# Patient Record
Sex: Female | Born: 1945 | Race: White | Hispanic: No | Marital: Married | Smoking: Never smoker
Health system: Southern US, Community
[De-identification: ages and names within clinical notes are randomized; demographics above are authoritative.]

---

## 2012-09-24 ENCOUNTER — Ambulatory Visit: Payer: Self-pay | Admitting: Sports Medicine

## 2012-10-29 ENCOUNTER — Ambulatory Visit (INDEPENDENT_AMBULATORY_CARE_PROVIDER_SITE_OTHER): Payer: Medicare Other | Admitting: Sports Medicine

## 2012-10-29 ENCOUNTER — Encounter: Payer: Self-pay | Admitting: Sports Medicine

## 2012-10-29 VITALS — BP 146/70 | Ht 66.0 in | Wt 137.0 lb

## 2012-10-29 DIAGNOSIS — M533 Sacrococcygeal disorders, not elsewhere classified: Secondary | ICD-10-CM

## 2012-10-29 NOTE — Progress Notes (Signed)
CC: Left SI joint pain HPI: Patient is a pleasant 67 year old female who presents as a new patient for left SI joint pain. She states that occasionally it is in both sides but is always worse than the left. She's is currently in physical therapy and has been working with a therapist for the last year. She states that the therapist "puts the SI joint back in place". She has been doing a lot of stretching and exercises. At times the SI joint feels like a lockup. This has been going on for about 4 years. She has a constant dull ache that is worse with certain movements and at times is so severe that she is immobilized. She's tried ice and heat but no NSAIDs due to her sensitive stomach. She is very bad asked reflux. She denies any radicular pain, paresthesias. Sometimes her pain improves when she flexes her hips up towards her body.  ROS: As above in the HPI. All other systems are stable or negative.  PMH: Acid reflux  Social: Patient is married. She does not smoke or drink alcohol. Family: Family history is negative for heart disease, high blood pressure, diabetes  Allergies: Compazine    OBJECTIVE: APPEARANCE:  Patient in no acute distress.The patient appeared well nourished and normally developed. HEENT: No scleral icterus. Conjunctiva non-injected Resp: Non labored Skin: No rash MSK:  Low back exam: - Full range of motion in flexion, extension, lateral bending, rotation. Patient has pain with extension.  - No tenderness to palpation over the spinous processes of the lumbar vertebra - Tenderness to palpation in the left SI joint. There is noted to be large fascial nodules at the SI joint on the left and right.  - Negative straight leg raise - Strength 5 out of 5 in the bilateral lower extremities - Reflexes 2+ bilaterally.  - No pain in hip rotation. Normal hip strength. - No leg length discrepancy - Excessive lumbar lordosis with anterior pelvic tilt  ASSESSMENT: SI joint dysfunction    PLAN: Recommended that patient continue to work with therapist if she feels that she benefits from this. She does have large SI joint nodules consistent with previous tear of lumbar fascia bilaterally. Think that she would benefit most from pelvic stabilization exercises to help offset her anterior pelvic tilt. She was given the following exercise recommendations. Followup in 2 months. Continue to do your cats and camels exercises Add these exercises: - Do pelvic tilt 5 breaths and 5 sets - Both knees to chest 5 breaths x 5 sets - Do pretzel stretch on side of bed 4 sets of 30 sec - Full sit up sets of 15. Work up to [employer]. - Knee to opposite shoulder sets of 15. Work up to [employer].

## 2012-10-29 NOTE — Patient Instructions (Addendum)
Thank you for coming in today You have SI Joint dysfunction. We need to retrain your pelvis to stay in position. Continue to do your cats and camels exercises Add these exercises: - Do pelvic tilt 5 breaths and 5 sets - Both knees to chest 5 breaths x 5 sets - Do pretzel stretch on side of bed 4 sets of 30 sec - Full sit up sets of 15. Work up to [employer]. - Knee to opposite shoulder sets of 15. Work up to [employer].

## 2012-12-30 ENCOUNTER — Ambulatory Visit: Payer: Medicare Other | Admitting: Sports Medicine

## 2013-07-28 ENCOUNTER — Encounter: Payer: Self-pay | Admitting: Sports Medicine

## 2013-07-28 ENCOUNTER — Ambulatory Visit (INDEPENDENT_AMBULATORY_CARE_PROVIDER_SITE_OTHER): Payer: Medicare Other | Admitting: Sports Medicine

## 2013-07-28 VITALS — BP 147/74 | Ht 66.0 in | Wt 129.0 lb

## 2013-07-28 DIAGNOSIS — M21611 Bunion of right foot: Secondary | ICD-10-CM | POA: Insufficient documentation

## 2013-07-28 DIAGNOSIS — M533 Sacrococcygeal disorders, not elsewhere classified: Secondary | ICD-10-CM

## 2013-07-28 DIAGNOSIS — M21619 Bunion of unspecified foot: Secondary | ICD-10-CM

## 2013-07-28 DIAGNOSIS — M21612 Bunion of left foot: Secondary | ICD-10-CM

## 2013-07-28 DIAGNOSIS — M23305 Other meniscus derangements, unspecified medial meniscus, unspecified knee: Secondary | ICD-10-CM

## 2013-07-28 NOTE — Assessment & Plan Note (Signed)
She has seen significant improvement by doing the SI joint exercises  I advised her to continue these at least 3 times a week

## 2013-07-28 NOTE — Patient Instructions (Addendum)
Your knees look good Some cartilage splits which are common  Use stationary bike starting at 15 mins 3 x week Build 5 mins per week Don't increase tension until you get to 45 mins  Straight leg raise with foot turned outward Build up to 15 slowly  With equipment don't do too much knee bend  Walking 3 x week  Start at 20 to 30 mins  See me as needed

## 2013-07-28 NOTE — Assessment & Plan Note (Signed)
sports insoles to provide some arch support  This felt very comfortable and also gave her less knee pain

## 2013-07-28 NOTE — Assessment & Plan Note (Addendum)
Musculoskeletal ultrasound Evaluation of the left knee reveals 1 area of medial meniscal irregularity and loss of thickness/ lateral meniscus normal No effusion is noted and normal quadriceps and patellar tendons  Right knee by comparison show normal medial meniscus and normal other structures    Likely Left medical meniscus tear that has healed - Discussed wrapping knee and specific exercises as well as low impact exercises such as biking - Start walking 3 x week for 20 to 30 mins - follow up PRN

## 2013-07-28 NOTE — Progress Notes (Signed)
Patient ID: Madison Anderson Kathol, female   DOB: [birth year], 68 y.o.   MRN: [id number]    [facility] [address] [place], [place] [address] Phone: [phone] Fax: [phone]   Patient Name: Madison Anderson Mearns Date of Birth: [birth year] Medical Record Number: [id number] Gender: female Date of Encounter: 07/28/2013  History of Present Illness:  Madison Anderson Lacewell is a 68 y.o. very pleasant female patient who presents with the following:  L knee pain  She states that about 6-7 weeks ago she was putting sheets on her bed when she twisted her knee wrong causinga  Popping sound and immediate medial knee pain with some swelling. She states that there was some popping intermittently in that knee but denies locking. She does have some muscular weakness and point tenderness in her L lat leg below the knee, in the popliteal fossa, and on L lat thigh she feels is associated. She states that sometimes it hurts to push on it and sometimes the pain radiates down the medial part of her leg.   Currently she has no mechanical symptoms emanate has not continued to swell Her pain level is down to a mild level and sometimes not bothersome at all  She notes some bilateral foot pain at times  Sacroiliac joint has improved substantially since last visit. However she has not been doing the exercises until we evaluated her knee  She would like to return to the gym if we think she can do so without risk  History  Substance Use Topics  . Smoking status: Never Smoker   . Smokeless tobacco: Never Used  . Alcohol Use: Not on file   No family history on file. Allergies  Allergen Reactions  . Compazine [Prochlorperazine]   . Flexeril [Cyclobenzaprine]     Medication list has been reviewed and updated.  Prior to Admission medications   Medication Sig Start Date End Date Taking? Authorizing Provider  Estrogens, Conjugated (PREMARIN VA) Place vaginally.    Historical Provider, MD  metoprolol  succinate (TOPROL-XL) 25 MG 24 hr tablet  10/20/12   Historical Provider, MD  ranitidine (ZANTAC) 300 MG tablet  10/22/12   Historical Provider, MD  sucralfate (CARAFATE) 1 G tablet  10/06/12   Historical Provider, MD  sucralfate (CARAFATE) 1 G tablet Take 1 g by mouth 4 (four) times daily.    Historical Provider, MD    Review of Systems:  No fevers, chills Otherwise Per HPI  Physical Examination: Filed Vitals:   07/28/13 1101  BP: 147/74   Filed Vitals:   07/28/13 1101  Height: 5\' 6"  (1.676 m)  Weight: 129 lb (58.514 kg)   Body mass index is 20.83 kg/(m^2).  Gen: NAD, alert, cooperative with exam Neuro: Alert and oriented, No gross deficits MSK:  Knee:LT Normal to inspection with no erythema or effusion or obvious bony abnormalities. Palpation normal with no warmth or joint line tenderness or patellar tenderness or condyle tenderness. ROM normal in flexion and extension and lower leg rotation. Ligaments with solid consistent endpoints including ACL, PCL, LCL, MCL. Negative Mcmurray's and provocative meniscal tests. Non painful patellar compression. Patellar and quadriceps tendons unremarkable. Hamstring and quadriceps strength is normal.  Patient shows early bunion formation bilaterally and some loss of the longitudinal arch  SI joint shows improvement movement and she does not get pain on knee to chest, or on knee to opposite shoulder or other stretches  Assessment and Plan: 68 y/o female with L knee pain, see problem specific a/p  Elenora GammaSamuel L Coreena Rubalcava, MD  Edited KBF

## 2015-02-01 ENCOUNTER — Ambulatory Visit: Payer: Medicare Other | Admitting: Sports Medicine

## 2015-02-15 ENCOUNTER — Ambulatory Visit: Payer: Medicare Other | Admitting: Sports Medicine

## 2016-07-03 ENCOUNTER — Ambulatory Visit: Payer: Medicare Other | Admitting: Sports Medicine

## 2016-08-07 ENCOUNTER — Encounter: Payer: Self-pay | Admitting: Sports Medicine

## 2016-08-07 ENCOUNTER — Ambulatory Visit (INDEPENDENT_AMBULATORY_CARE_PROVIDER_SITE_OTHER): Payer: Medicare Other | Admitting: Sports Medicine

## 2016-08-07 ENCOUNTER — Ambulatory Visit: Payer: Self-pay

## 2016-08-07 VITALS — BP 150/73 | Ht 66.0 in | Wt 135.0 lb

## 2016-08-07 DIAGNOSIS — G8929 Other chronic pain: Secondary | ICD-10-CM

## 2016-08-07 DIAGNOSIS — M25562 Pain in left knee: Secondary | ICD-10-CM | POA: Diagnosis present

## 2016-08-07 MED ORDER — DICLOFENAC SODIUM 1 % TD GEL: TRANSDERMAL | 0 refills | Status: AC

## 2016-08-07 NOTE — Patient Instructions (Addendum)
Do 3 sets of 10 of the H stretch, T stretch, and rowing stretch.  Start eating anti-inflammatory foods daily such as collards, beets, and green leafy vegetables.  Apply ice to the knee two to three times a day (10-15 minutes each time).  Apply voltaren gel (anti-inflammatory gel) to the knee up to three times per day for pain and inflammation

## 2016-08-07 NOTE — Progress Notes (Signed)
Subjective: CC: knee pain and migrating arthralgias HPI: Patient is a 71 y.o. female presenting to clinic today for L knee pain and migrating arthralgias.   L knee pain: this is the main reason for her visit.  Patient notes a nodule and tenderness to palpation inferior to the patella x 1 year that has progressively gotten worse.  Pain is only with palpation and accidentally hitting the ant. knee. The pain when she hits this area is sharp and severe.  No pain with ambulation at this spot.  Also, she notes pain in the anterior knee with ascending/descending stairs. Pain is 3/10, dull ache. No swelling of knee.  Nothing makes it better or worse.   She was previously very athletic, walking on treadmill regularly but stop 2 months ago due to social issues.  Arthralgias:  Patient had flu vaccine in Jan (unsure if this is the cause) Had pain in R shoulder in March, it then went to the L shoulder.  Then went to wrists, L>R.  Also notes pain with turning wrists L more severe than R. This pain comes and goes. Typically resolves within 3 days spontaneously.  No inciting factors.  No swelling, erythema, warmth noted.  Doesn't take medicine due to a sensitive stomach.  No fevers, chills or rashes.  Has not seen anyone about this.  No family h/o arthritis.   Social History: never smoker   ROS: All other systems reviewed and are negative.  Past Medical History Patient Active Problem List   Diagnosis Date Noted  . 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 Bilateral bunions 07/28/2013  . 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 metoprolol succinate (TOPROL-XL) 25 MG 24 hr tablet Take by mouth.    . sucralfate (CARAFATE) 1 g tablet Take by mouth.    . Estrogens, Conjugated (PREMARIN VA) Place vaginally.    Marland Kitchen. ketoconazole (NIZORAL) 2 % cream apply to affected area at bedtime  TO EARS.  0  . metoprolol succinate (TOPROL-XL) 25 MG 24 hr tablet     . omeprazole (PRILOSEC) 40 MG capsule Take 40 mg by mouth.    . ranitidine (ZANTAC) 300 MG tablet     . sucralfate (CARAFATE) 1 G tablet     . sucralfate (CARAFATE) 1 G tablet Take 1 g by mouth 4 (four) times daily.    . Vitamin D, Ergocalciferol, (DRISDOL) 50000 units CAPS capsule   0   No current facility-administered medications for this visit.     Objective: Office vital signs reviewed. BP (!) 150/73   Ht 5\' 6"  (1.676 m)   Wt 135 lb (61.2 kg)   BMI 21.79 kg/m    Physical Examination:  General: Awake, alert, well nourished, NAD  Wrists: normal to inspection. No swelling of hands or wrists noted. No bony abnormalities. No tenderness to palpation. Full ROM. 5/5 strength.   Shoulders:  Normal to inspection. Tenderness on top of rotator cuff insertion site on the L. No tenderness on the R. Full ROM without pain. Negative empty can test, Hawkins, Speeds, and Yergason. 5/5 strength in shoulders bilaterally.   Left knee: swelling inferior to the patella, around the tibial tuberosity. No erythema or ecchymoses.   No obvious Baker's cysts. Swelling is exquisitely tender to palpation (possibly around pes anserine bursa).  No warmth or joint line tenderness or patellar tenderness or condyle tenderness.  ROM normal in flexion (135 degrees) and  extension (0 degrees) and lower leg rotation. Ligaments with solid consistent endpoints including ACL, PCL, LCL, MCL.  Negative Anterior Drawer.  Negative Mcmurray's and Thessaly.  Non painful patellar compression.  Normal Patellar glide.  No apprehension  Patellar and quadriceps tendons unremarkable. Hamstring and quadriceps strength is normal.  Ultrasound Left knee:  significant swelling and inflammation noted at the pes anserine bursa. Mild fluid around sartorius and hamstring tendons, moderate fluid noted at gracilis tendon. Patellar tendon normal Tibial tubercle normal No  effusion noted at Suprapatellar pouch  Impression - pes anserine bursitis  Ultrasound and interpretation by Sibyl Parr. Fields, MD    Assessment/Plan: 71 y/o F with a PMHx of IBS and per her report numerous "inflammatory reactions" coming in with the pain complaint of L knee pain.  Pes anserine bursitis: history, exam, and [place] consistent with bursitis. Unknown trauma however noted to have significant edema around the gracilis tendon. Discussed management options. Patient opts for icing BID, voltaren gel TID PRN, and rest. Patient to f/u in 6 weeks.   Shoulder pain: patient reports joint pain diffusely intermittently. Suspect some of this could be secondary to posture (patient has shoulders rotated in). No exam findings consistent with RA or other rheumatologic diseases.   Discussed home exercises such as H and T stretches.   Joanna Puff PGY-3, [facility]  I observed and examined the patient with the resident and agree with assessment and plan.  Note reviewed and modified by me. Enid Baas, MD

## 2016-09-20 ENCOUNTER — Ambulatory Visit: Payer: Medicare Other | Admitting: Sports Medicine

## 2016-10-04 ENCOUNTER — Ambulatory Visit (INDEPENDENT_AMBULATORY_CARE_PROVIDER_SITE_OTHER): Payer: Medicare Other | Admitting: Sports Medicine

## 2016-10-04 ENCOUNTER — Encounter: Payer: Self-pay | Admitting: Sports Medicine

## 2016-10-04 DIAGNOSIS — M21612 Bunion of left foot: Secondary | ICD-10-CM

## 2016-10-04 DIAGNOSIS — M21611 Bunion of right foot: Secondary | ICD-10-CM | POA: Diagnosis not present

## 2016-10-04 DIAGNOSIS — G8929 Other chronic pain: Secondary | ICD-10-CM

## 2016-10-04 DIAGNOSIS — M25562 Pain in left knee: Secondary | ICD-10-CM | POA: Diagnosis not present

## 2016-10-04 MED ORDER — TRIAMCINOLONE ACETONIDE 10 MG/ML IJ SUSP
10.0000 mg | Freq: Once | INTRAMUSCULAR | Status: AC
  Administered 2016-10-04: 10 mg via INTRA_ARTICULAR

## 2016-10-04 NOTE — Assessment & Plan Note (Signed)
CSI today  Easy exercises with SLR  Heat and ICE prn

## 2016-10-04 NOTE — Progress Notes (Signed)
   Subjective:    Patient ID: Madison Anderson, female    DOB: [birth year], 71 y.o.   MRN: [id number]  HPI cc: left knee pain  Patient here for follow up of left knee pain after 6 weeks. Was instructed to do topical voltaren gel and ice. Ice made it worse. Did not use gel due to sensitivity to NSAIDs. Reports knee area will intermittently swell. Overall she feels the problem is slightly worse than when she came in 6 weeks ago. Denies new injury or trauma to the area.   Also endorses new left thumb pain that has been ongoing for 2 weeks. She did some heavy yardwork 2 weeks ago and it has been hurting ever since. The pain is deep in the base of the thumb and she feels her thumb catches at times  Review of Systems- no neck or lower back pain      Objective:   Physical Exam  Well nourished, well developed, in NAD BP (!) 142/76   Ht 5\' 6"  (1.676 m)   Wt 135 lb (61.2 kg)   BMI 21.79 kg/m   Left knee- no swelling or erythema, tender to palpation medial to tibial tuberosity. Knee joint is stable. Puffy over the pes anserine but no pain on adduction, hackysack and HS testing Strength 5/5. Neurovascularly in tact.   Left thumb- no swelling or erythema. Tender to palpation along flexor pollicis longus tendon. Small nodule at base of MCP Normal ROM. Neurovascularly in tact.    Feet show bilateral bunion formation and loss of long arch  Assessment & Plan:   Left knee pain- possibly secondary to pes anserine bursitis although muscle strength in tact without pain with resistance. -bursa injected today  Procedure:  Injection of left pes anserine Consent obtained and verified. Time-out conducted. Noted no overlying erythema, induration, or other signs of local infection. Skin prepped in a sterile fashion. Topical analgesic spray: Ethyl chloride. Completed without difficulty.  Directed from medial approach to pes Meds: 1 cc kenalog 10 and 1 cc Lidocaine 1% Pain immediately improved suggesting  accurate placement of the medication. Advised to call if fevers/chills, erythema, induration, drainage, or persistent bleeding.     -advised heat or ice for pain -follow up in 4 weeks  Left thumb pain- thumb trigger finger -advised massage and motion exercises in warm water -patient advised to follow up if thumb start getting stuck  for trigger nodule injcetion if needed

## 2016-10-04 NOTE — Assessment & Plan Note (Signed)
Add sports insoles for some support Use supportive shoes

## 2016-10-04 NOTE — Patient Instructions (Signed)
   You can apply heat or ice to your knee, whichever helps ease the pain.  Insert insoles into larger shoes with cloth toes.  Do stretching exercises for thumb in warm water. If the thumb locks up please call for an appointment to get this injected.  Follow up in 6 weeks or sooner if needed.

## 2016-11-20 ENCOUNTER — Ambulatory Visit (INDEPENDENT_AMBULATORY_CARE_PROVIDER_SITE_OTHER): Payer: Medicare Other | Admitting: Sports Medicine

## 2016-11-20 ENCOUNTER — Ambulatory Visit
Admission: RE | Admit: 2016-11-20 | Discharge: 2016-11-20 | Disposition: A | Discharge status: Home / Self Care | Payer: Medicare Other | Source: Ambulatory Visit | Attending: Sports Medicine | Admitting: Sports Medicine

## 2016-11-20 VITALS — BP 112/70 | Ht 66.0 in | Wt 136.0 lb

## 2016-11-20 DIAGNOSIS — G8929 Other chronic pain: Secondary | ICD-10-CM

## 2016-11-20 DIAGNOSIS — M25562 Pain in left knee: Secondary | ICD-10-CM | POA: Diagnosis not present

## 2016-11-20 DIAGNOSIS — M65312 Trigger thumb, left thumb: Secondary | ICD-10-CM

## 2016-11-20 DIAGNOSIS — M79605 Pain in left leg: Secondary | ICD-10-CM

## 2016-11-20 MED ORDER — TRIAMCINOLONE ACETONIDE 10 MG/ML IJ SUSP
5.0000 mg | Freq: Once | INTRAMUSCULAR | Status: AC
  Administered 2016-11-20: 5 mg via INTRA_ARTICULAR

## 2016-11-20 NOTE — Assessment & Plan Note (Signed)
CSI today under [place]  We will follow to see result  Hand motion in warm water

## 2016-11-20 NOTE — Progress Notes (Signed)
Chief complaint: Left knee pain 12 weeks, left thumb pain 8 weeks  History of present illness: Madison Anderson is a 71 year old female who presents to the sports medicine office today for follow-up of her left knee pain as well as her left thumb pain. She was last seen here in the office back on 10/04/16.  In regards to her left knee pain, she was diagnosed with pes anserine bursitis, did receive cortisone injection at last appointment. Unfortunately, she did not have any interval improvement in her symptoms after cortisone injection. She reports a pain along the inferomedial aspect of her left knee, does report of swelling and point tenderness over the bursa. She reports of any type of prolonged standing is aggravating factor. She has tried Voltaren gel as well as ice, reports that the Voltaren gel did cause her to have reflux symptoms, reports that ice made it worse. She does not report of any interval injury, trauma, or anything else that could've aggravated the pain. She does not report of any numbness, tingling, or weakness in the left lower extremity.  She also reports today of continued left thumb pain base of her thumb. At last appointment she was diagnosed with trigger thumb, was given sides exercises, reports no interval improvement in symptoms. She was told that she may have to have trigger thumb injection, she is amenable to this today. She reports of a locking sensation near the interphalangeal joint of her thumb. She is right hand dominant. She reports having trouble gripping things with her left hand and opening up jars. No numbness, tingling, or weakness in her left arm, hand, or fingers.  Interval past medical history, surgical history, family history, social history obtained, no changes.  Review of systems:  As stated above  Physical exam: Vital signs are reviewed and are documented in the chart Gen.: Alert, oriented, appears stated age, in no apparent distress HEENT: Moist oral  mucosa Respiratory: Normal respirations, able to speak in full sentences Cardiac: Regular rate, distal pulses 2+ Integumentary: No rashes on visible skin:  Neurologic: Strength 5/5, sensation 2+ in bilateral upper and lower extremities Psych: Normal affect, mood is described as good Musculoskeletal: Inspection of left knee reveals slight effusion and swelling over the past anserine bursa, slight warmth, no erythema or ecchymosis noted, she is tender to light palpation over this area, no tenderness over the quadriceps tendon, patellar tendon, medial joint line, lateral joint line of the knee, she does have full active and passive range of motion of her knee, able to go from 0  to 130;   inspection of her left thumb reveals no obvious deformity or muscular atrophy, no warmth, erythema, ecchymosis, or effusion noted, she is tender to palpation over the palmar aspect of left thumb near MCP1, nodule palpated, has minimal ability to flex the interphalangeal joint of her thumb, otherwise has normal thumb opposition and finger abduction and adduction  Procedure: Informed consent verbally obtained, discussed risk of bleeding, infection, and steroid flare. Timeout conducted. Under ultrasound guidance, able to visualize small sesamoid bone near 1st MCP, with hypo-echogenicity superficial to sesamoid bone suggestive of cystic lesion, used this area as target for injection. Skin was sterilely prepped using alcohol. Ethyl chloride was used for topical analgesic spray. Sterile gel was used. Under ultrasound guidance, nodule in cystic lesion was injected with 30 gauge, 0.5 cm needle 0.4: 0.4 cc Kenalog 10 and 1% lidocaine without epinephrine from lateral approach. Visualized needle and ultrasound targeted in correct location. Pain immediately improved after procedure,  suggesting accurate placement of medication. Skin was cleaned and Band-Aid was applied. Discussed call office with any symptoms of fevers, chills,  night sweats, or any other concerns  Assessment and plan: 1. Left thumb trigger finger 2. Left proximal tibia pain/pes anserine bursitis  Left thumb trigger finger -Discussed given no improvements with massage and other conservative management, discussed cortisone injection into nodule today, she is agreeable to this  Left proximal tibia pain/past anserine bursitis -She reports unfortunately not having interval improvement after cortisone injection to the pes anserine bursa -She does report of pain travel along medial aspect of tibia a fourth the way down, will go ahead and obtain x-ray of her left proximal tibia, including AP and lateral view to ensure no other bony abnormality is occurring, as x-ray is unrevealing next step and be custom orthotics  Will call after x-ray results, with plan of care to be determined afterwards.  Haynes Kerns, M.D. Primary Care Sports Medicine Fellow [place]  I observed and examined the patient with the resident and agree with assessment and plan.  Note reviewed and modified by me. Enid Baas, MD

## 2016-11-20 NOTE — Assessment & Plan Note (Addendum)
Will follow up this with XR  See if this relates to tibia as pain tends to be below knee on pes anserine area  Tibia shows a defined area of soft tissue change with possible swelling related ro ruptures VV or other structure

## 2016-11-27 ENCOUNTER — Telehealth: Payer: Self-pay | Admitting: *Deleted

## 2016-11-27 DIAGNOSIS — M25562 Pain in left knee: Secondary | ICD-10-CM

## 2016-11-27 DIAGNOSIS — M79605 Pain in left leg: Secondary | ICD-10-CM

## 2016-11-27 NOTE — Telephone Encounter (Signed)
Will fax order to Dr Verl BangsMarston's office at [phone] and they will call her with an appt time

## 2018-05-12 IMAGING — DX DG TIBIA/FIBULA 2V*L*
2 series · 2 of 2 positions shown · non-contrast
Comparison: None in PACs

CLINICAL DATA: Proximal tibia and fibular discomfort, chronic. No
known injury. History of vein stripping procedure 8 years ago.

EXAM:
LEFT TIBIA AND FIBULA - 2 VIEW

[dg tibia/fibula left (1 of 2)]
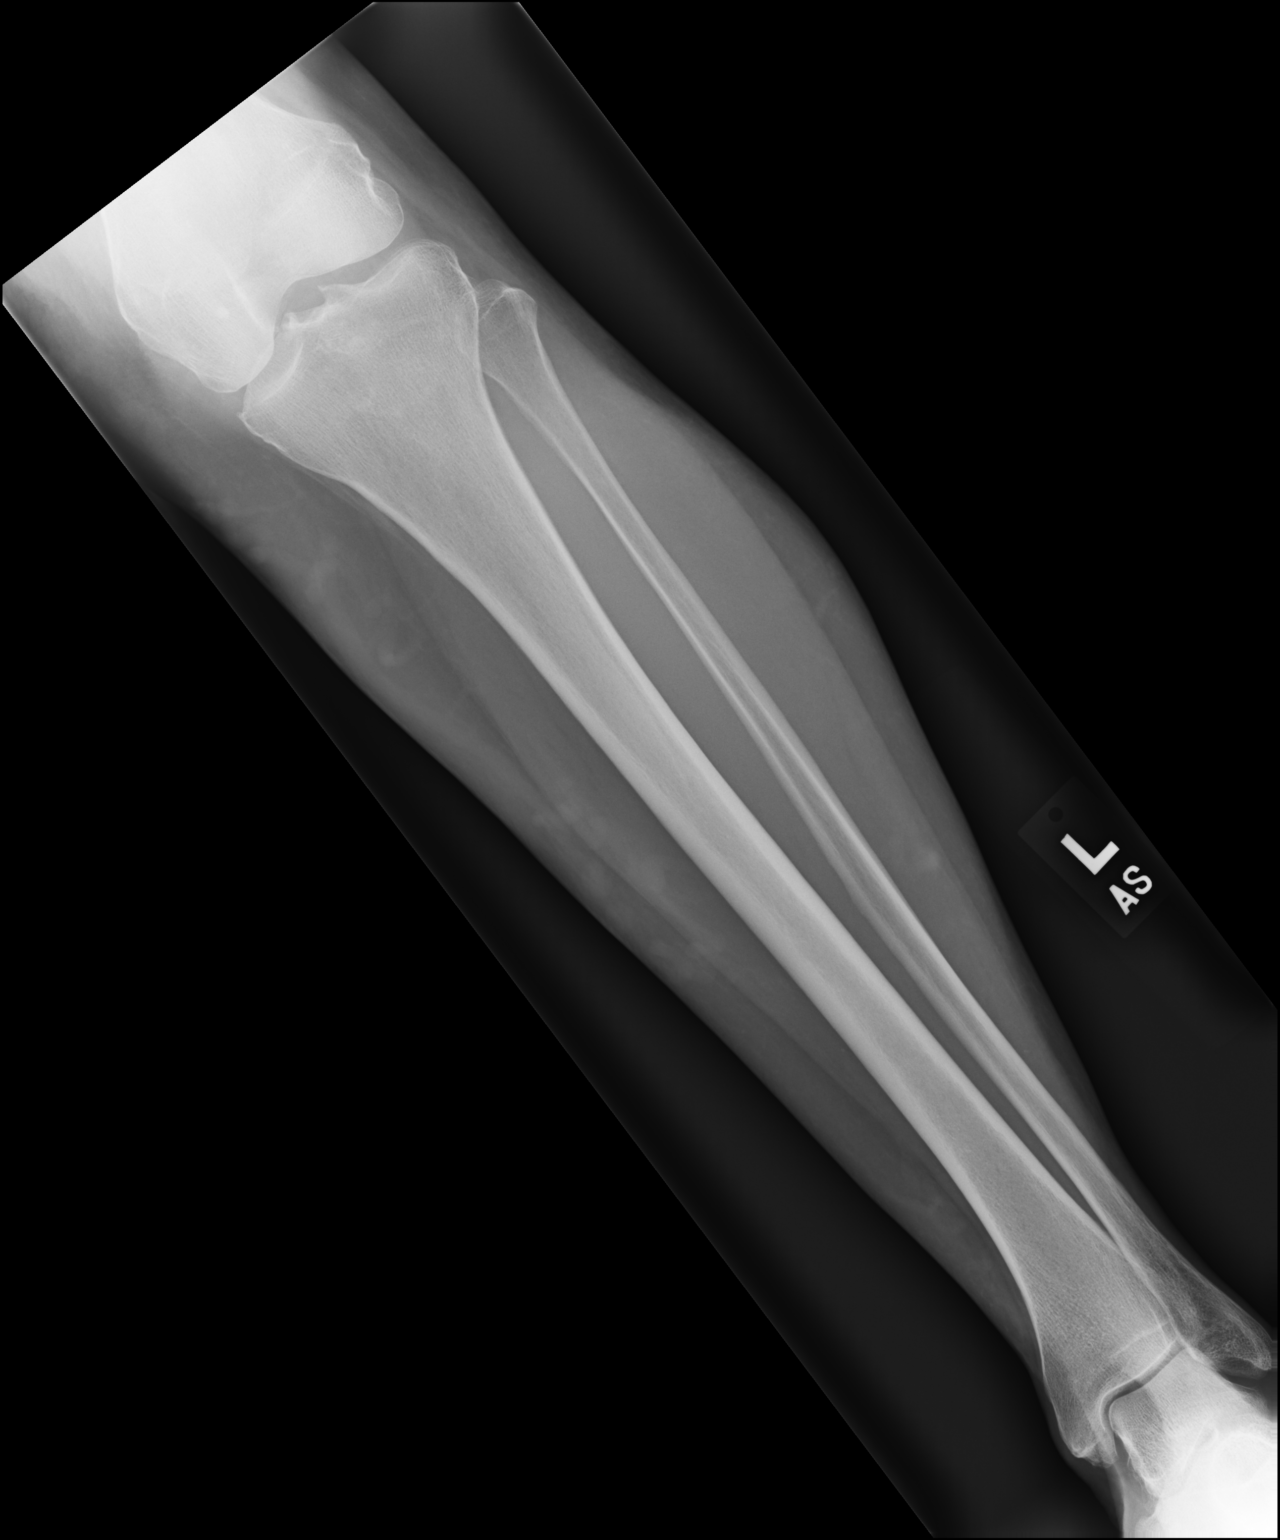

[dg tibia/fibula left (2 of 2)]
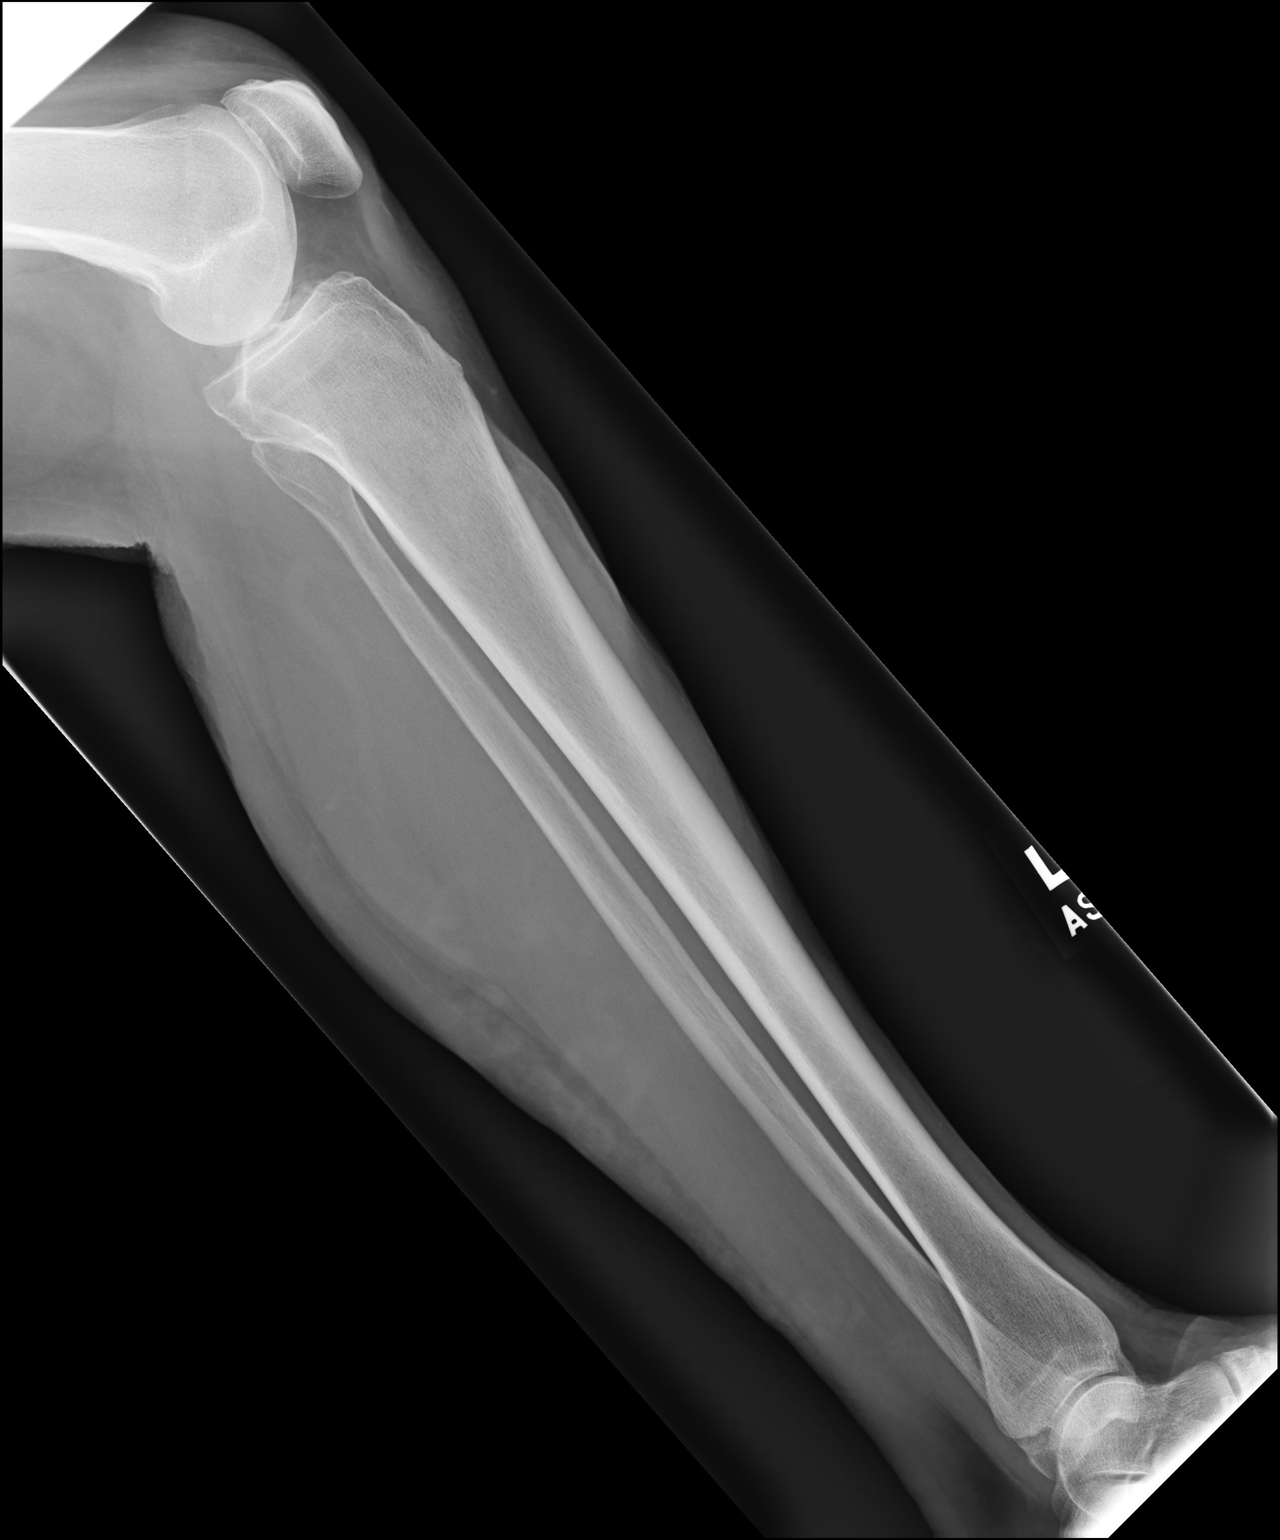

[2 of 2 positions shown; findings below may reference images not displayed]

FINDINGS: The tibia and fibula are adequately mineralized. There is no lytic
or blastic lesion or periosteal reaction. There is beaking of the
tibial spines. The knee joint is otherwise unremarkable. The
observed portions of the ankle appear normal. There are densities
compatible with venous varicosities within the calf soft tissues.
There is increased density within the pretibial soft tissues in the
upper third of the leg that is of uncertain significance.
IMPRESSION: No acute bony abnormality is observed. There is mild degenerative
change of the knee. There is mildly increased density within the
pretibial soft tissues in the upper third of the leg which is of
uncertain significance.
